# Patient Record
Sex: Male | Born: 1965 | Hispanic: No
Health system: Southern US, Community
[De-identification: ages and names within clinical notes are randomized; demographics above are authoritative.]

## PROBLEM LIST (undated history)

## (undated) ENCOUNTER — Emergency Department (HOSPITAL_COMMUNITY): Payer: Self-pay | Source: Home / Self Care

## (undated) DIAGNOSIS — E119 Type 2 diabetes mellitus without complications: Secondary | ICD-10-CM

---

## 2019-05-14 ENCOUNTER — Emergency Department (HOSPITAL_COMMUNITY): Payer: No Typology Code available for payment source

## 2019-05-14 ENCOUNTER — Emergency Department (HOSPITAL_COMMUNITY)
Admission: EM | Admit: 2019-05-14 | Discharge: 2019-05-15 | Disposition: A | Discharge status: Home / Self Care | Payer: No Typology Code available for payment source | Attending: Emergency Medicine | Admitting: Emergency Medicine

## 2019-05-14 ENCOUNTER — Encounter (HOSPITAL_COMMUNITY): Payer: Self-pay

## 2019-05-14 DIAGNOSIS — Y998 Other external cause status: Secondary | ICD-10-CM | POA: Diagnosis not present

## 2019-05-14 DIAGNOSIS — E119 Type 2 diabetes mellitus without complications: Secondary | ICD-10-CM | POA: Insufficient documentation

## 2019-05-14 DIAGNOSIS — Y9389 Activity, other specified: Secondary | ICD-10-CM | POA: Insufficient documentation

## 2019-05-14 DIAGNOSIS — S20212A Contusion of left front wall of thorax, initial encounter: Secondary | ICD-10-CM | POA: Insufficient documentation

## 2019-05-14 DIAGNOSIS — I672 Cerebral atherosclerosis: Secondary | ICD-10-CM | POA: Diagnosis not present

## 2019-05-14 DIAGNOSIS — S298XXA Other specified injuries of thorax, initial encounter: Secondary | ICD-10-CM | POA: Diagnosis present

## 2019-05-14 DIAGNOSIS — S42025A Nondisplaced fracture of shaft of left clavicle, initial encounter for closed fracture: Secondary | ICD-10-CM | POA: Insufficient documentation

## 2019-05-14 DIAGNOSIS — S301XXA Contusion of abdominal wall, initial encounter: Secondary | ICD-10-CM | POA: Insufficient documentation

## 2019-05-14 DIAGNOSIS — S161XXA Strain of muscle, fascia and tendon at neck level, initial encounter: Secondary | ICD-10-CM | POA: Diagnosis not present

## 2019-05-14 DIAGNOSIS — Y92410 Unspecified street and highway as the place of occurrence of the external cause: Secondary | ICD-10-CM | POA: Diagnosis not present

## 2019-05-14 HISTORY — DX: Type 2 diabetes mellitus without complications: E11.9

## 2019-05-14 LAB — I-STAT CHEM 8, ED
BUN: 19 mg/dL (ref 6–20)
Calcium, Ion: 1.1 mmol/L — ABNORMAL LOW (ref 1.15–1.40)
Chloride: 100 mmol/L (ref 98–111)
Creatinine, Ser: 0.6 mg/dL — ABNORMAL LOW (ref 0.61–1.24)
Glucose, Bld: 259 mg/dL — ABNORMAL HIGH (ref 70–99)
HCT: 43 % (ref 39.0–52.0)
Hemoglobin: 14.6 g/dL (ref 13.0–17.0)
Potassium: 3.3 mmol/L — ABNORMAL LOW (ref 3.5–5.1)
Sodium: 135 mmol/L (ref 135–145)
TCO2: 25 mmol/L (ref 22–32)

## 2019-05-14 LAB — CBG MONITORING, ED: Glucose-Capillary: 232 mg/dL — ABNORMAL HIGH (ref 70–99)

## 2019-05-14 LAB — CBC
HCT: 41.7 % (ref 39.0–52.0)
Hemoglobin: 13.8 g/dL (ref 13.0–17.0)
MCH: 29.2 pg (ref 26.0–34.0)
MCHC: 33.1 g/dL (ref 30.0–36.0)
MCV: 88.3 fL (ref 80.0–100.0)
Platelets: 354 10*3/uL (ref 150–400)
RBC: 4.72 MIL/uL (ref 4.22–5.81)
RDW: 13 % (ref 11.5–15.5)
WBC: 9 10*3/uL (ref 4.0–10.5)
nRBC: 0 % (ref 0.0–0.2)
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
## 2019-05-14 NOTE — ED Notes (Signed)
Patient transported to X-ray 
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
## 2019-05-15 ENCOUNTER — Other Ambulatory Visit: Payer: Self-pay

## 2019-05-15 ENCOUNTER — Emergency Department (HOSPITAL_COMMUNITY): Payer: No Typology Code available for payment source

## 2019-05-15 ENCOUNTER — Encounter (HOSPITAL_COMMUNITY): Payer: Self-pay | Admitting: Radiology

## 2019-05-15 LAB — URINALYSIS, ROUTINE W REFLEX MICROSCOPIC
Bacteria, UA: NONE SEEN
Bilirubin Urine: NEGATIVE
Glucose, UA: >=500 mg/dL — AB
Hgb urine dipstick: NEGATIVE
Ketones, ur: NEGATIVE mg/dL
Leukocytes,Ua: NEGATIVE
Nitrite: NEGATIVE
Protein, ur: 30 mg/dL — AB
Specific Gravity, Urine: 1.041 — ABNORMAL HIGH (ref 1.005–1.030)
pH: 6 (ref 5.0–8.0)

## 2019-05-15 LAB — PROTIME-INR
INR: 1 (ref 0.8–1.2)
Prothrombin Time: 13.3 seconds (ref 11.4–15.2)

## 2019-05-15 LAB — COMPREHENSIVE METABOLIC PANEL
ALT: 31 U/L (ref 0–44)
AST: 33 U/L (ref 15–41)
Albumin: 3.6 g/dL (ref 3.5–5.0)
Alkaline Phosphatase: 98 U/L (ref 38–126)
Anion gap: 10 (ref 5–15)
BUN: 18 mg/dL (ref 6–20)
CO2: 24 mmol/L (ref 22–32)
Calcium: 8.8 mg/dL — ABNORMAL LOW (ref 8.9–10.3)
Chloride: 100 mmol/L (ref 98–111)
Creatinine, Ser: 0.62 mg/dL (ref 0.61–1.24)
GFR calc Af Amer: >60 mL/min (ref >60)
GFR calc non Af Amer: >60 mL/min (ref >60)
Glucose, Bld: 263 mg/dL — ABNORMAL HIGH (ref 70–99)
Potassium: 3.4 mmol/L — ABNORMAL LOW (ref 3.5–5.1)
Sodium: 134 mmol/L — ABNORMAL LOW (ref 135–145)
Total Bilirubin: 0.9 mg/dL (ref 0.3–1.2)
Total Protein: 6.4 g/dL — ABNORMAL LOW (ref 6.5–8.1)

## 2019-05-15 LAB — SAMPLE TO BLOOD BANK

## 2019-05-15 LAB — CDS SEROLOGY

## 2019-05-15 MED ORDER — MORPHINE SULFATE (PF) 4 MG/ML IV SOLN
4.0000 mg | Freq: Once | INTRAVENOUS | Status: AC
  Administered 2019-05-15: 4 mg via INTRAVENOUS
  Filled 2019-05-15: qty 1

## 2019-05-15 MED ORDER — HYDROCODONE-ACETAMINOPHEN 5-325 MG PO TABS: 1.0000 | ORAL_TABLET | Freq: Four times a day (QID) | ORAL | 0 refills | Status: AC | PRN

## 2019-05-15 MED ORDER — IOHEXOL 300 MG/ML  SOLN
100.0000 mL | Freq: Once | INTRAMUSCULAR | Status: AC | PRN
  Administered 2019-05-15: 100 mL via INTRAVENOUS

## 2019-05-15 NOTE — Discharge Instructions (Addendum)
Wear arm sling for comfort.  Hydrocodone as prescribed as needed for pain.  Ice for 20 minutes every 2 hours while awake for the next 2 days.  Follow-up with orthopedics in the next 3 to 4 days.  The contact information for Dr. Stann Mainland has been provided in this discharge summary for you to call and make these arrangements.

## 2020-08-20 IMAGING — CR LEFT CLAVICLE - 2+ VIEWS
2 series · 2 of 2 positions shown · non-contrast
Comparison: None.

CLINICAL DATA: Pain

EXAM:
LEFT CLAVICLE - 2+ VIEWS

[clavicle ap]
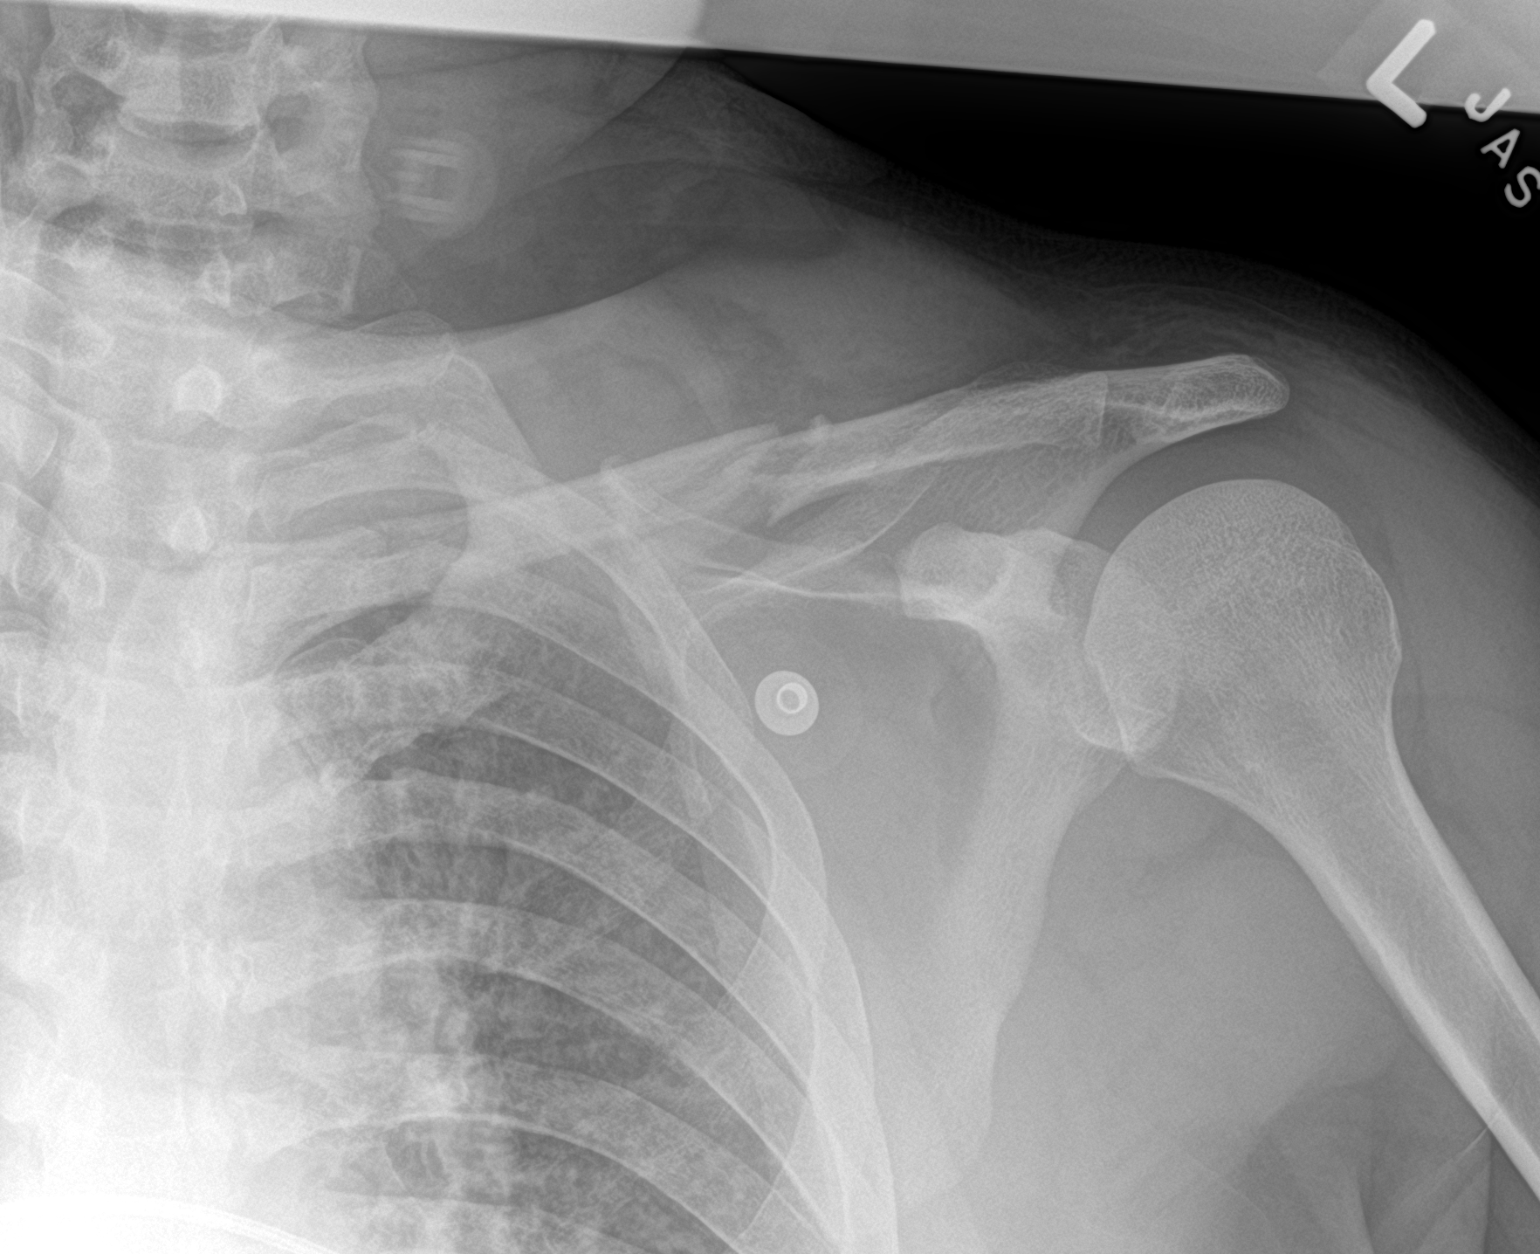

[clavicle axial]
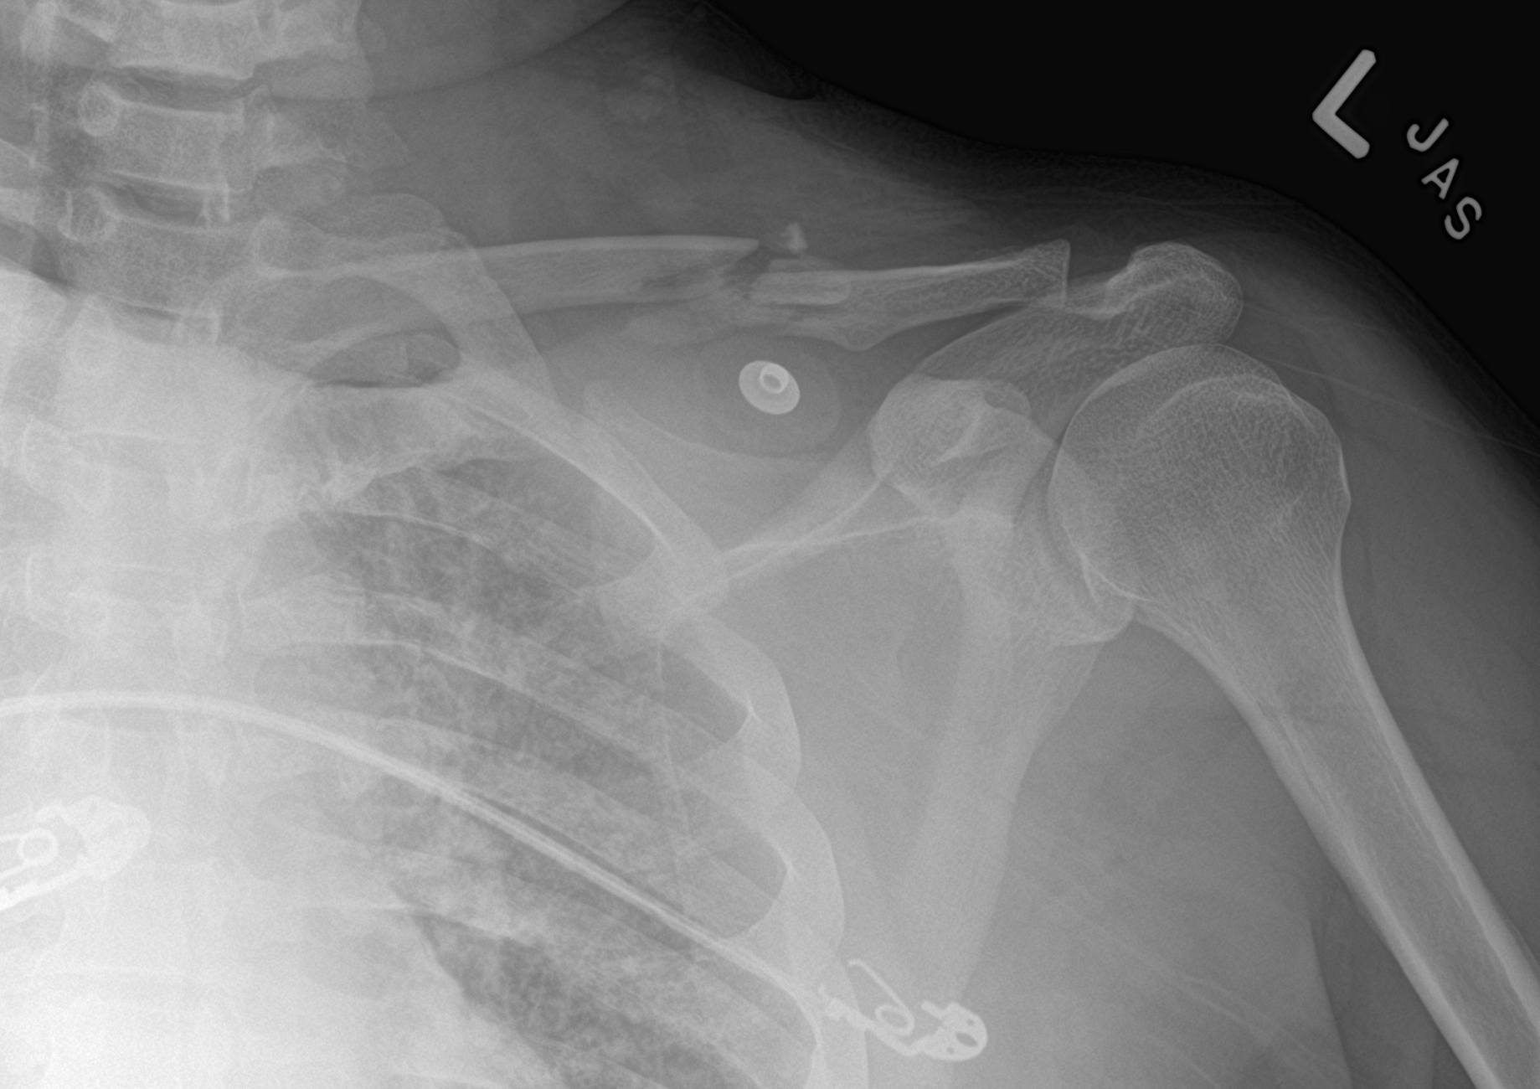

[2 of 2 positions shown; findings below may reference images not displayed]

FINDINGS: There is an acute mildly comminuted fracture involving the midshaft
of the left clavicle. There is minimal angulation. There is
surrounding soft tissue swelling.
IMPRESSION: Acute comminuted minimally angulated fracture involving the midshaft
of the left clavicle.
